# Patient Record
Sex: Male | Born: 1970 | Race: White | Hispanic: No | Marital: Married | Smoking: Never smoker
Health system: Southern US, Community
[De-identification: ages and names within clinical notes are randomized; demographics above are authoritative.]

## PROBLEM LIST (undated history)

## (undated) DIAGNOSIS — G35 Multiple sclerosis: Secondary | ICD-10-CM

---

## 2015-03-12 ENCOUNTER — Emergency Department (HOSPITAL_BASED_OUTPATIENT_CLINIC_OR_DEPARTMENT_OTHER): Payer: BLUE CROSS/BLUE SHIELD

## 2015-03-12 ENCOUNTER — Emergency Department (HOSPITAL_BASED_OUTPATIENT_CLINIC_OR_DEPARTMENT_OTHER)
Admission: EM | Admit: 2015-03-12 | Discharge: 2015-03-12 | Discharge status: Against Medical Advice | Payer: BLUE CROSS/BLUE SHIELD | Attending: Emergency Medicine | Admitting: Emergency Medicine

## 2015-03-12 ENCOUNTER — Encounter (HOSPITAL_BASED_OUTPATIENT_CLINIC_OR_DEPARTMENT_OTHER): Payer: Self-pay | Admitting: *Deleted

## 2015-03-12 DIAGNOSIS — Z8669 Personal history of other diseases of the nervous system and sense organs: Secondary | ICD-10-CM | POA: Insufficient documentation

## 2015-03-12 DIAGNOSIS — R55 Syncope and collapse: Secondary | ICD-10-CM | POA: Diagnosis not present

## 2015-03-12 HISTORY — DX: Multiple sclerosis: G35

## 2015-03-12 LAB — CBC WITH DIFFERENTIAL/PLATELET
BASOS ABS: 0 10*3/uL (ref 0.0–0.1)
Basophils Relative: 0 % (ref 0–1)
EOS ABS: 0.1 10*3/uL (ref 0.0–0.7)
Eosinophils Relative: 1 % (ref 0–5)
HCT: 38.3 % — ABNORMAL LOW (ref 39.0–52.0)
Hemoglobin: 13.2 g/dL (ref 13.0–17.0)
LYMPHS PCT: 23 % (ref 12–46)
Lymphs Abs: 2.3 10*3/uL (ref 0.7–4.0)
MCH: 31.6 pg (ref 26.0–34.0)
MCHC: 34.5 g/dL (ref 30.0–36.0)
MCV: 91.6 fL (ref 78.0–100.0)
Monocytes Absolute: 0.8 10*3/uL (ref 0.1–1.0)
Monocytes Relative: 8 % (ref 3–12)
NEUTROS ABS: 6.7 10*3/uL (ref 1.7–7.7)
Neutrophils Relative %: 68 % (ref 43–77)
Platelets: 243 10*3/uL (ref 150–400)
RBC: 4.18 MIL/uL — ABNORMAL LOW (ref 4.22–5.81)
RDW: 13.2 % (ref 11.5–15.5)
WBC: 9.9 10*3/uL (ref 4.0–10.5)

## 2015-03-12 LAB — BASIC METABOLIC PANEL
Anion gap: 11 (ref 5–15)
BUN: 11 mg/dL (ref 6–20)
CALCIUM: 8.6 mg/dL — AB (ref 8.9–10.3)
CO2: 23 mmol/L (ref 22–32)
Chloride: 107 mmol/L (ref 101–111)
Creatinine, Ser: 0.88 mg/dL (ref 0.61–1.24)
GFR calc Af Amer: >60 mL/min (ref >60)
GLUCOSE: 121 mg/dL — AB (ref 65–99)
Potassium: 3.5 mmol/L (ref 3.5–5.1)
Sodium: 141 mmol/L (ref 135–145)

## 2015-03-12 LAB — TROPONIN I

## 2015-03-12 NOTE — ED Provider Notes (Signed)
CSN: [id number]     Arrival date & time 03/12/15  1203 History   First MD Initiated Contact with Patient 03/12/15 1209     Chief Complaint  Patient presents with  . Loss of Consciousness     (Consider location/radiation/quality/duration/timing/severity/associated sxs/prior Treatment) HPI Comments: Pt comes with c/o of syncope. Pt was in the passenger seat of the car. He states that he has acute onset of not feeling right and then his wife states that he passed out. Denies cp or sob. States that he is feeling better at this time. Ems reports that his pressure was in the 90's on scene. He had eaten this morning. Pt was diagnosed with ms in January. Pt states that 2 weeks ago he started taking buspirone bid for anxiety. He states that he was feeling fine getting into the car this morning  The history is provided by the patient. No language interpreter was used.    Past Medical History  Diagnosis Date  . MS (multiple sclerosis)    History reviewed. No pertinent past surgical history. History reviewed. No pertinent family history. History  Substance Use Topics  . Smoking status: Never Smoker   . Smokeless tobacco: Not on file  . Alcohol Use: Yes     Comment: beer daily  (4 cans / day)    Review of Systems  All other systems reviewed and are negative.     Allergies  Review of patient's allergies indicates no known allergies.  Home Medications   Prior to Admission medications   Not on File   BP 104/64 mmHg  Pulse 67  Temp(Src) 97.9 F (36.6 C) (Oral)  Resp 18  Ht 5\' 10"  (1.778 m)  Wt 162 lb (73.483 kg)  BMI 23.24 kg/m2  SpO2 100% Physical Exam  Constitutional: He is oriented to person, place, and time. He appears well-developed and well-nourished.  HENT:  Head: Normocephalic and atraumatic.  Cardiovascular: Normal rate and regular rhythm.   Pulmonary/Chest: Effort normal and breath sounds normal.  Abdominal: Soft. Bowel sounds are normal. There is no tenderness.   Musculoskeletal: Normal range of motion.  Neurological: He is alert and oriented to person, place, and time. Coordination normal.  Skin: Skin is warm and dry.  Psychiatric: He has a normal mood and affect.  Nursing note and vitals reviewed.   ED Course  Procedures (including critical care time) Labs Review Labs Reviewed  BASIC METABOLIC PANEL - Abnormal; Notable for the following:    Glucose, Bld 121 (*)    Calcium 8.6 (*)    All other components within normal limits  CBC WITH DIFFERENTIAL/PLATELET - Abnormal; Notable for the following:    RBC 4.18 (*)    HCT 38.3 (*)    All other components within normal limits  TROPONIN I    Imaging Review Dg Chest 2 View  03/12/2015   CLINICAL DATA:  Loss of consciousness  EXAM: CHEST  2 VIEW  COMPARISON:  None.  FINDINGS: The heart size and mediastinal contours are within normal limits. Both lungs are clear. The visualized skeletal structures are unremarkable.  IMPRESSION: No active cardiopulmonary disease.   Electronically Signed   By: Christiana Pellant M.D.   On: 03/12/2015 13:08    ED ECG REPORT   Date: 03/12/2015  Rate: 64  Rhythm: normal sinus rhythm  QRS Axis: normal  Intervals: normal  ST/T Wave abnormalities: normal  Conduction Disutrbances:none  Narrative Interpretation:   Old EKG Reviewed: none available  I have personally reviewed the  EKG tracing and agree with the computerized printout as noted.   MDM   Final diagnoses:  Syncope, unspecified syncope type    Pt refusing to be admitted for further evaluation. Pt signing out ama and states will follow up with pcp at home. Discussed risk with pt and wife and they have opted to leave still    Teressa Lower, NP 03/12/15 1801  Margarita Grizzle, MD 03/14/15 [phone]

## 2015-03-12 NOTE — ED Notes (Signed)
Assumed care of patient from Casimiro Needle, [place]. Pt lying on stretcher, resting quietly, no distress, vitals stable, no complaints. Pt states he feels fine and he is ready to leave. EDP awaiting return of patient's wife, to discuss disposition.

## 2015-03-12 NOTE — ED Notes (Signed)
Placed on cont cardiac/ pox monitoring

## 2015-03-12 NOTE — Discharge Instructions (Signed)
Syncope °Syncope is a medical term for fainting or passing out. This means you lose consciousness and drop to the ground. People are generally unconscious for less than 5 minutes. You may have some muscle twitches for up to 15 seconds before waking up and returning to normal. Syncope occurs more often in older adults, but it can happen to anyone. While most causes of syncope are not dangerous, syncope can be a sign of a serious medical problem. It is important to seek medical care.  °CAUSES  °Syncope is caused by a sudden drop in blood flow to the brain. The specific cause is often not determined. Factors that can bring on syncope include: °· Taking medicines that lower blood pressure. °· Sudden changes in posture, such as standing up quickly. °· Taking more medicine than prescribed. °· Standing in one place for too long. °· Seizure disorders. °· Dehydration and excessive exposure to heat. °· Low blood sugar (hypoglycemia). °· Straining to have a bowel movement. °· Heart disease, irregular heartbeat, or other circulatory problems. °· Fear, emotional distress, seeing blood, or severe pain. °SYMPTOMS  °Right before fainting, you may: °· Feel dizzy or light-headed. °· Feel nauseous. °· See all white or all black in your field of vision. °· Have cold, clammy skin. °DIAGNOSIS  °Your health care provider will ask about your symptoms, perform a physical exam, and perform an electrocardiogram (ECG) to record the electrical activity of your heart. Your health care provider may also perform other heart or blood tests to determine the cause of your syncope which may include: °· Transthoracic echocardiogram (TTE). During echocardiography, sound waves are used to evaluate how blood flows through your heart. °· Transesophageal echocardiogram (TEE). °· Cardiac monitoring. This allows your health care provider to monitor your heart rate and rhythm in real time. °· Holter monitor. This is a portable device that records your  heartbeat and can help diagnose heart arrhythmias. It allows your health care provider to track your heart activity for several days, if needed. °· Stress tests by exercise or by giving medicine that makes the heart beat faster. °TREATMENT  °In most cases, no treatment is needed. Depending on the cause of your syncope, your health care provider may recommend changing or stopping some of your medicines. °HOME CARE INSTRUCTIONS °· Have someone stay with you until you feel stable. °· Do not drive, use machinery, or play sports until your health care provider says it is okay. °· Keep all follow-up appointments as directed by your health care provider. °· Lie down right away if you start feeling like you might faint. Breathe deeply and steadily. Wait until all the symptoms have passed. °· Drink enough fluids to keep your urine clear or pale yellow. °· If you are taking blood pressure or heart medicine, get up slowly and take several minutes to sit and then stand. This can reduce dizziness. °SEEK IMMEDIATE MEDICAL CARE IF:  °· You have a severe headache. °· You have unusual pain in the chest, abdomen, or back. °· You are bleeding from your mouth or rectum, or you have black or tarry stool. °· You have an irregular or very fast heartbeat. °· You have pain with breathing. °· You have repeated fainting or seizure-like jerking during an episode. °· You faint when sitting or lying down. °· You have confusion. °· You have trouble walking. °· You have severe weakness. °· You have vision problems. °If you fainted, call your local emergency services (911 in [place] Do not drive   yourself to the hospital.  °MAKE SURE YOU: °· Understand these instructions. °· Will watch your condition. °· Will get help right away if you are not doing well or get worse. °Document Released: 09/09/2005 Document Revised: 09/14/2013 Document Reviewed: 11/08/2011 °[employer]® Patient Information ©2015 [employer]. This information is not intended to replace  advice given to you by your health care provider. Make sure you discuss any questions you have with your health care provider. ° °

## 2015-03-12 NOTE — ED Notes (Signed)
Wife states pt was sitting in passenger seat, had syncopal episode

## 2015-03-12 NOTE — ED Notes (Signed)
Patient transported to X-ray 

## 2015-03-12 NOTE — ED Notes (Signed)
Family at bedside. 

## 2015-03-12 NOTE — ED Notes (Signed)
Pt resting quietly , lying on stretcher, watching TV, denies any dizziness, chest pain or any discomfort at this time

## 2015-03-12 NOTE — ED Notes (Signed)
MD at bedside. 

## 2016-12-31 IMAGING — CR DG CHEST 2V
2 series · 2 of 2 positions shown · non-contrast
Comparison: None.

CLINICAL DATA: Loss of consciousness

EXAM:
CHEST  2 VIEW

[w chest pa]
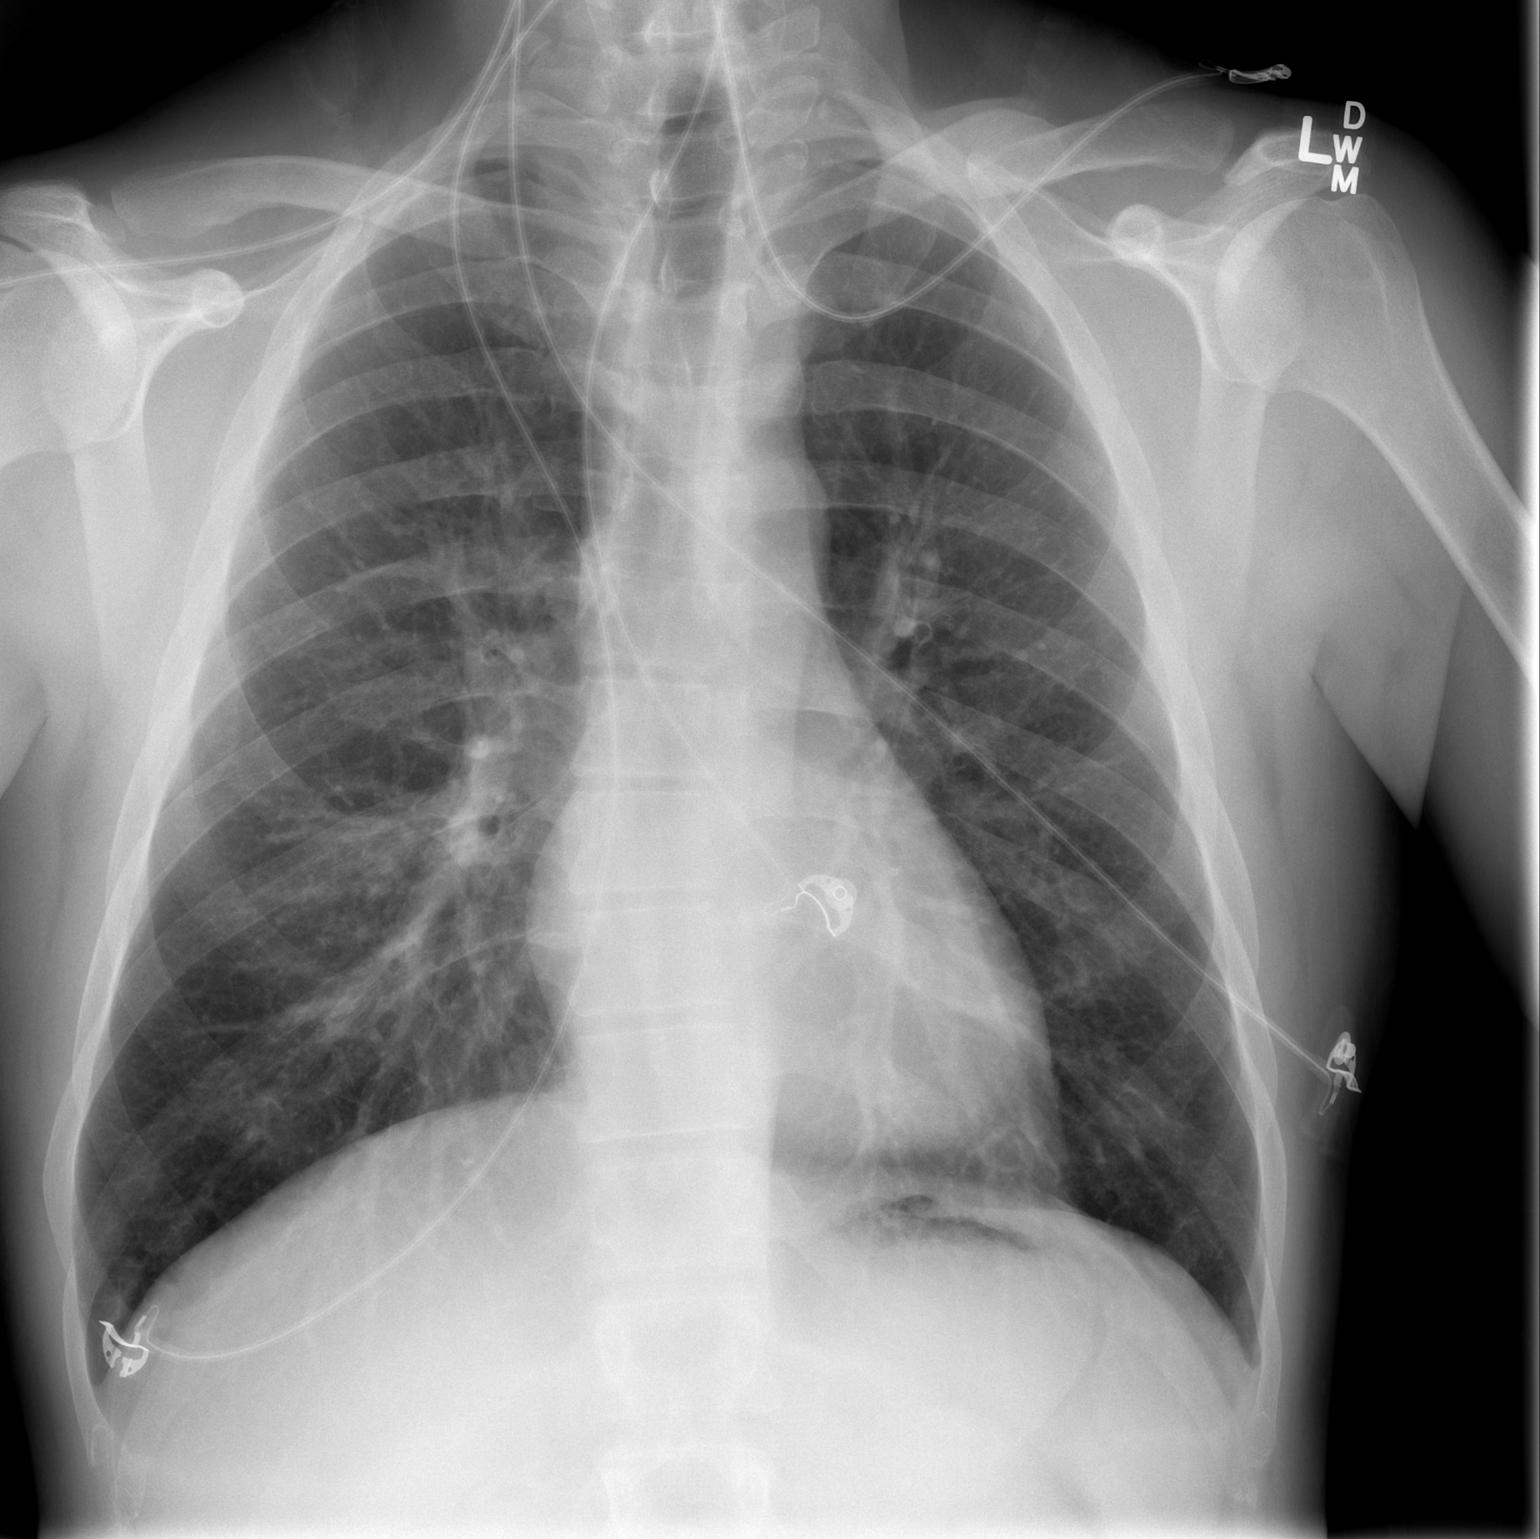

[w chest lat]
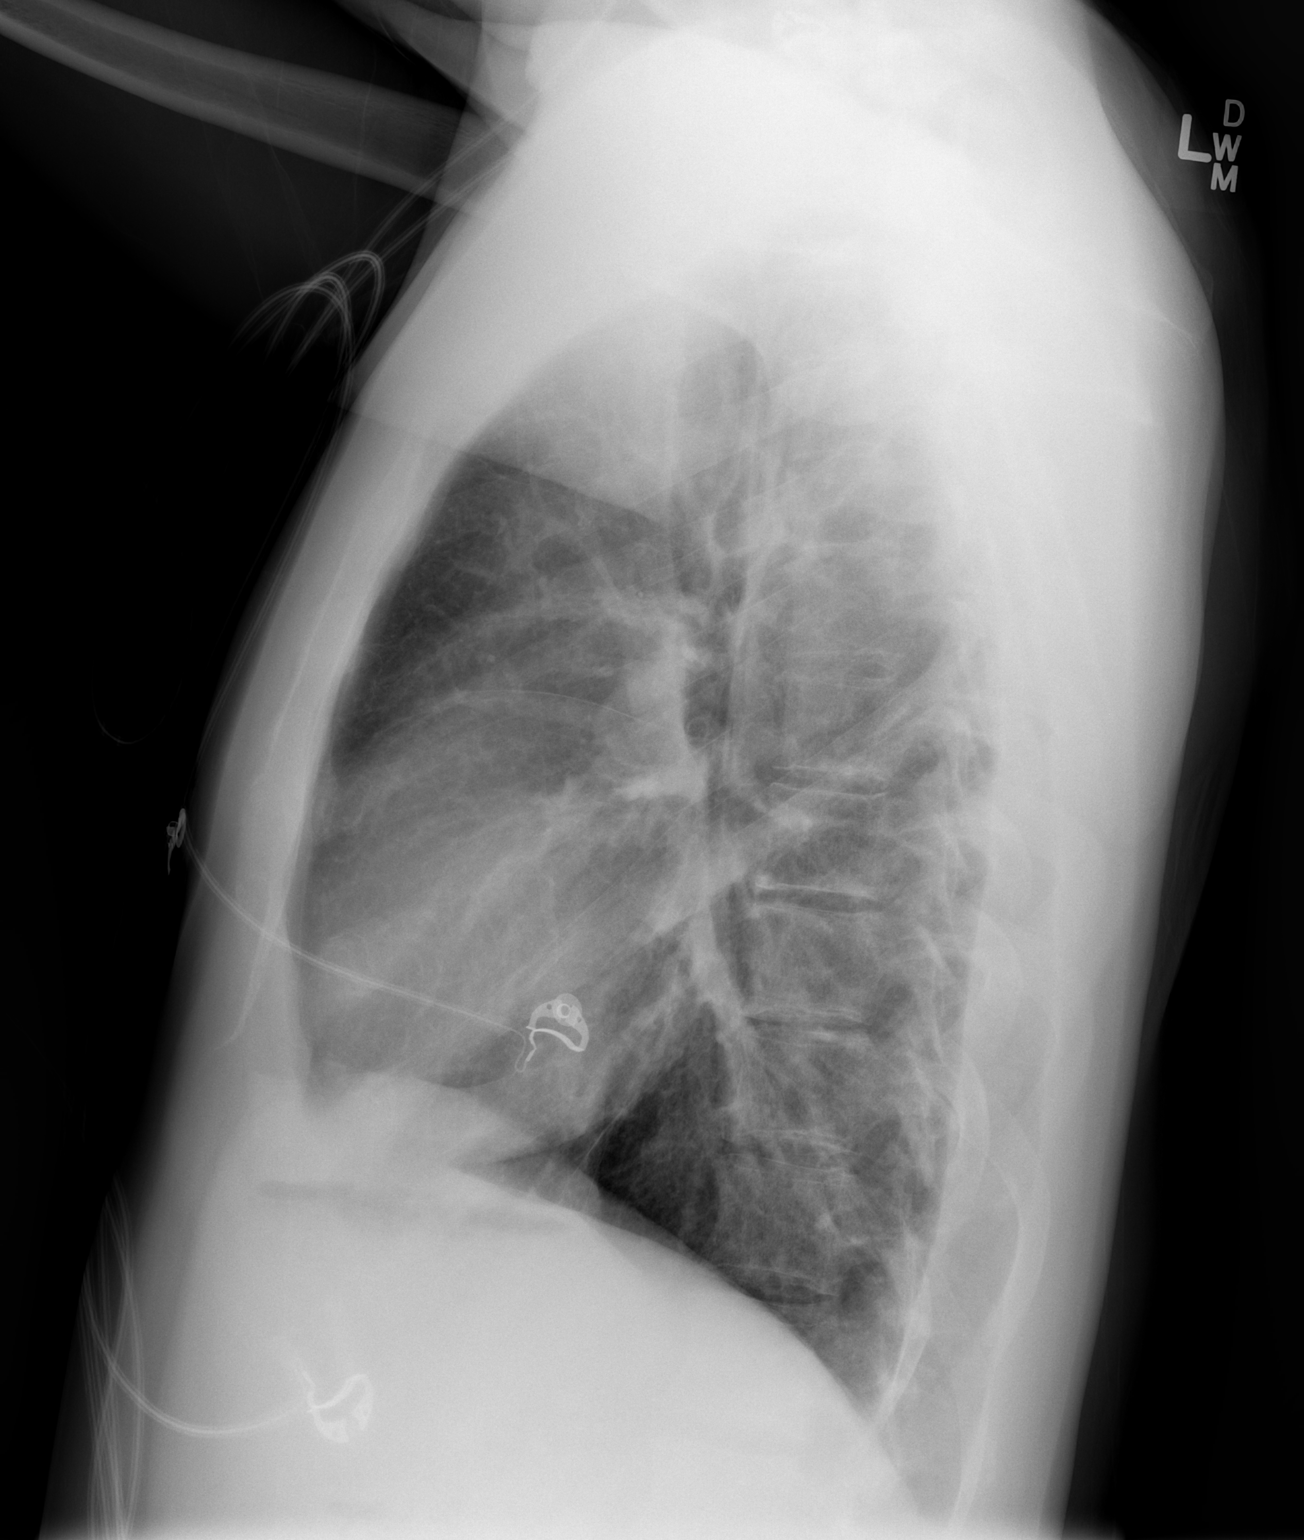

[2 of 2 positions shown; findings below may reference images not displayed]

FINDINGS: The heart size and mediastinal contours are within normal limits.
Both lungs are clear. The visualized skeletal structures are
unremarkable.
IMPRESSION: No active cardiopulmonary disease.
